# Patient Record
Sex: Male | Born: 1945 | Race: White | Hispanic: No | Marital: Married | Smoking: Never smoker
Health system: Southern US, Community
[De-identification: ages and names within clinical notes are randomized; demographics above are authoritative.]

## PROBLEM LIST (undated history)

## (undated) DIAGNOSIS — F329 Major depressive disorder, single episode, unspecified: Secondary | ICD-10-CM

## (undated) DIAGNOSIS — I219 Acute myocardial infarction, unspecified: Secondary | ICD-10-CM

## (undated) DIAGNOSIS — I10 Essential (primary) hypertension: Secondary | ICD-10-CM

## (undated) DIAGNOSIS — F32A Depression, unspecified: Secondary | ICD-10-CM

## (undated) DIAGNOSIS — E119 Type 2 diabetes mellitus without complications: Secondary | ICD-10-CM

## (undated) DIAGNOSIS — E039 Hypothyroidism, unspecified: Secondary | ICD-10-CM

## (undated) DIAGNOSIS — E079 Disorder of thyroid, unspecified: Secondary | ICD-10-CM

## (undated) HISTORY — DX: Type 2 diabetes mellitus without complications: E11.9

## (undated) HISTORY — DX: Depression, unspecified: F32.A

## (undated) HISTORY — DX: Essential (primary) hypertension: I10

## (undated) HISTORY — DX: Disorder of thyroid, unspecified: E07.9

## (undated) HISTORY — DX: Acute myocardial infarction, unspecified: I21.9

## (undated) HISTORY — DX: Hypothyroidism, unspecified: E03.9

---
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
## 1997-11-30 ENCOUNTER — Encounter: Admission: RE | Admit: 1997-11-30 | Discharge: 1997-11-30 | Payer: Self-pay | Admitting: Family Medicine

## 1998-06-13 ENCOUNTER — Encounter: Admission: RE | Admit: 1998-06-13 | Discharge: 1998-06-13 | Payer: Self-pay | Admitting: Family Medicine

## 1998-06-13 ENCOUNTER — Ambulatory Visit (HOSPITAL_COMMUNITY): Admission: RE | Admit: 1998-06-13 | Discharge: 1998-06-13 | Payer: Self-pay

## 2006-03-01 ENCOUNTER — Ambulatory Visit: Payer: Self-pay | Admitting: Internal Medicine

## 2007-03-29 ENCOUNTER — Ambulatory Visit: Payer: Self-pay | Admitting: Ophthalmology

## 2007-05-16 ENCOUNTER — Telehealth: Payer: Self-pay | Admitting: Internal Medicine

## 2011-05-08 ENCOUNTER — Inpatient Hospital Stay: Payer: Self-pay | Admitting: Internal Medicine

## 2012-08-10 IMAGING — CR DG CHEST 1V PORT
1 series · 1 of 1 positions shown · non-contrast
Comparison: none

REASON FOR EXAM: Chest pain
COMMENTS:

PROCEDURE:     DXR - DXR PORTABLE CHEST SINGLE VIEW  - May 08, 2011 [DATE]
RESULT:     The lungs are clear. The heart and pulmonary vessels are normal.
The bony and mediastinal structures are unremarkable. There is no effusion.
There is no pneumothorax or evidence of congestive failure.

[view not recorded]
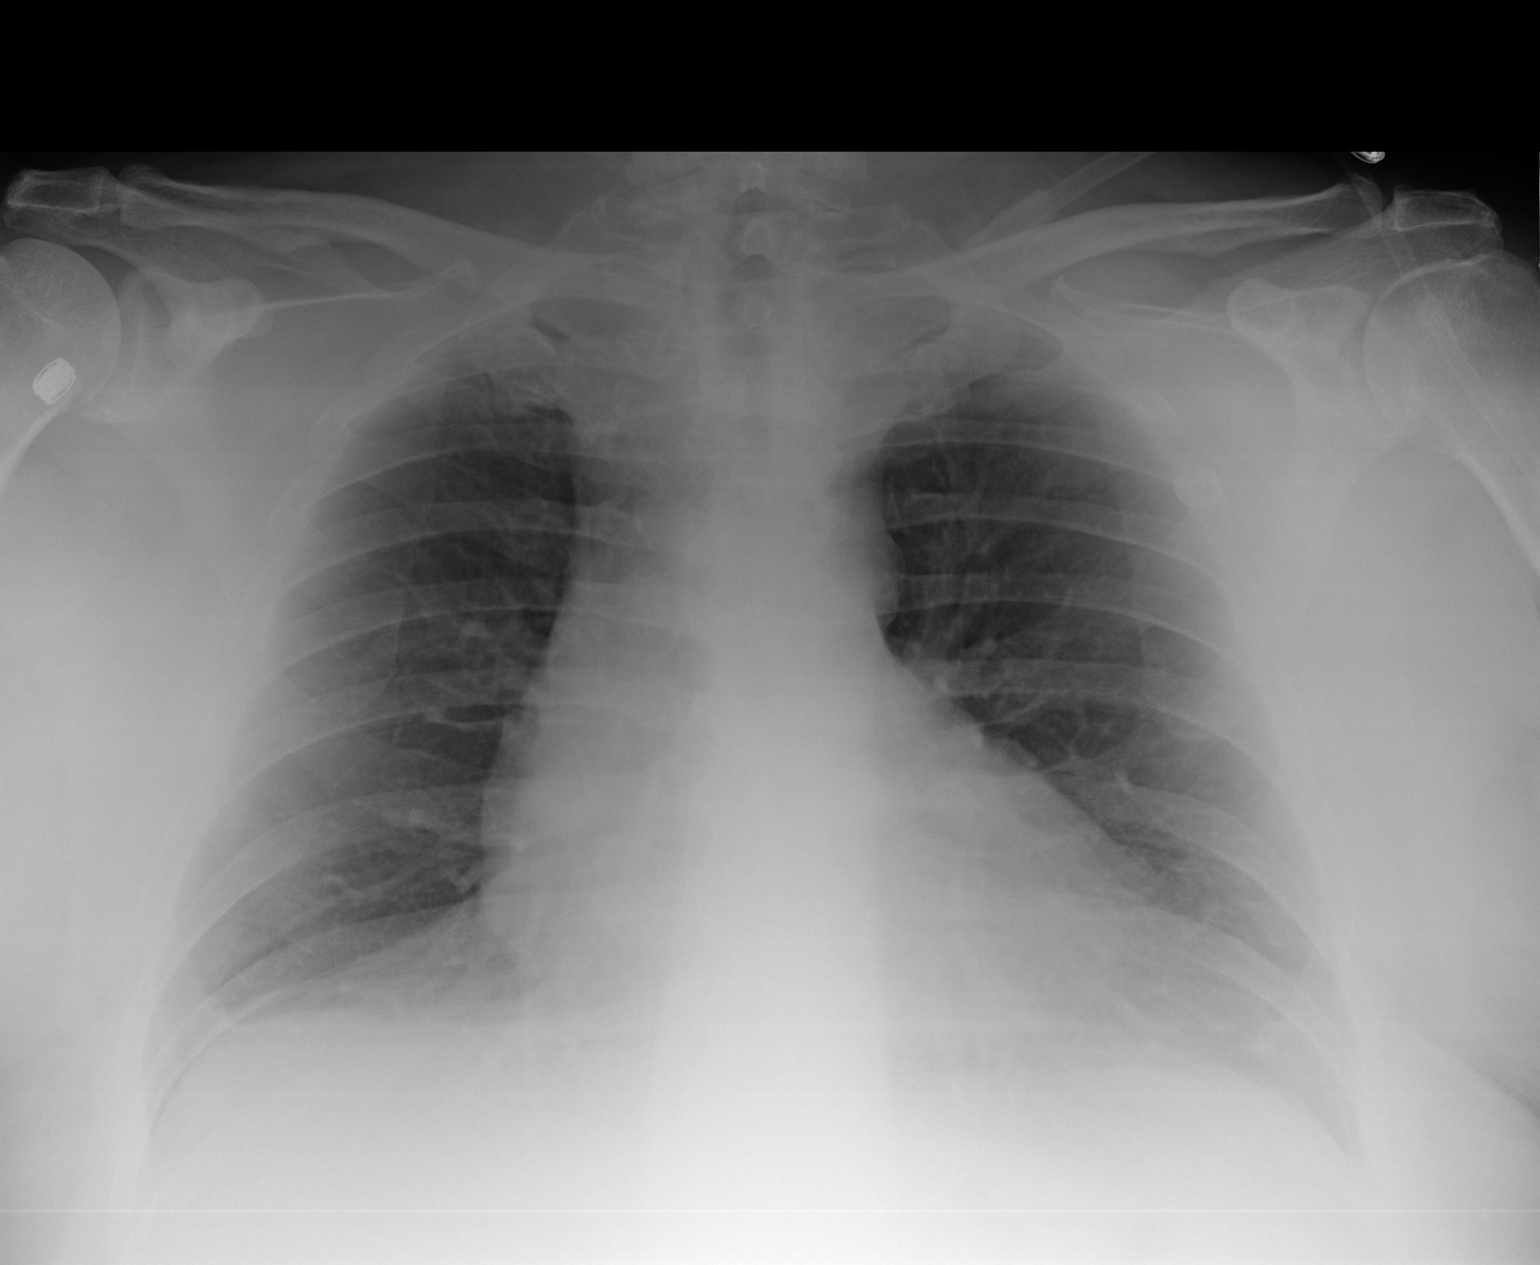

[1 of 1 positions shown; findings below may reference images not displayed]

IMPRESSION: No acute cardiopulmonary disease.

## 2012-08-12 IMAGING — NM NUCLEAR MEDICINE HEPATOHBILIARY INCLUDE GB
1 series · 11 of 11 positions shown · non-contrast
Comparison: none

REASON FOR EXAM: ruq pain
COMMENTS:

PROCEDURE:     NM  - NM HEPATOBILIARY IMAGE  - May 10, 2011  [DATE]
RESULT:
Procedure: The patient received intravenous administration of 8.54 mCi
Choletec. Standard imaging of the abdomen was obtained.

[Series 1000: gallbladder statics · 4.80mm/px · 11 of 11 slices shown]
[im 1/11]
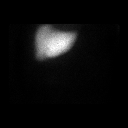
[im 2/11]
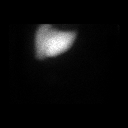
[im 3/11]
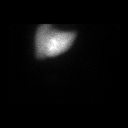
[im 4/11]
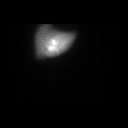
[im 5/11]
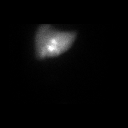
[im 6/11]
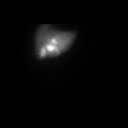
[im 7/11]
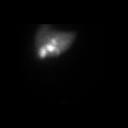
[im 8/11]
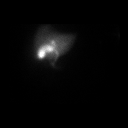
[im 9/11]
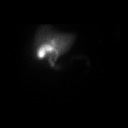
[im 10/11]
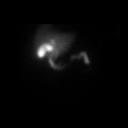
[im 11/11]
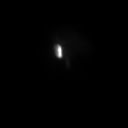

[11 of 11 positions shown; findings below may reference images not displayed]

FINDINGS: Diffuse homogeneous radiotracer activity is identified within the
liver. Intrahepatic biliary excretion is appreciated at 15 minutes, common
bile duct activity is identified at 20 minutes. Early gallbladder activity
is identified at 20 minutes with increased intensity appreciated throughout
the remainder of the study. Distal common bile duct activity is identified
at 65 minutes with excretion into small bowel at 70 minutes and peristalsis
appreciated throughout the remainder of the study.
IMPRESSION: Unremarkable nuclear medicine hepatobiliary evaluation.

## 2012-08-13 IMAGING — CR DG CHEST 1V PORT
1 series · 1 of 1 positions shown · non-contrast
Comparison: none

REASON FOR EXAM: low O2 sats rapid resp rate
COMMENTS:

[view not recorded]
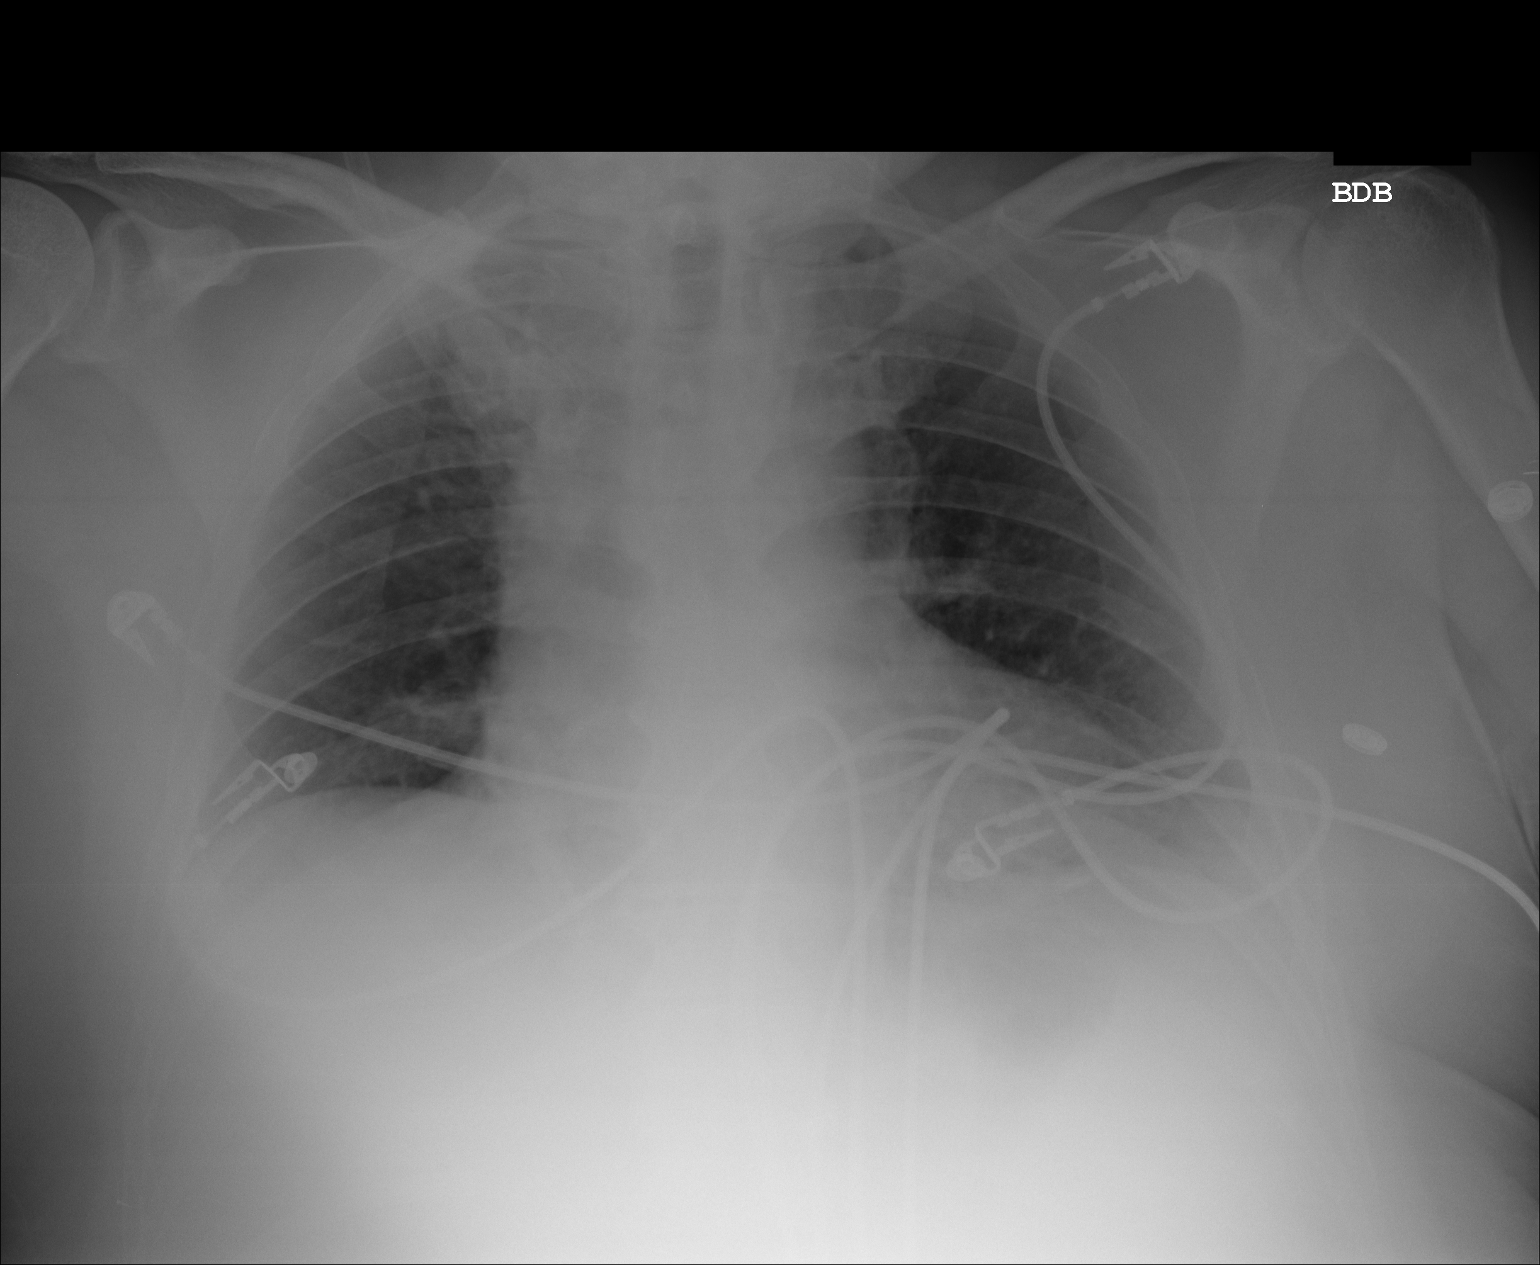

[1 of 1 positions shown; findings below may reference images not displayed]

PROCEDURE:     DXR - DXR PORTABLE CHEST SINGLE VIEW  - May 11, 2011  [DATE]

RESULT:     Comparison is made to prior exam 05/08/2011. The lung fields are
clear. No pneumonia, pneumothorax or pleural effusion is seen. No acute
changes of the heart or pulmonary vascularity are noted. Monitoring
electrodes are present.
IMPRESSION: 1. No acute changes are identified as compared to prior exam of 05/08/2011.

## 2013-04-10 ENCOUNTER — Ambulatory Visit: Payer: Self-pay | Admitting: Unknown Physician Specialty

## 2014-07-14 IMAGING — CR DG CHEST 2V
1 series · 2 of 2 positions shown · non-contrast
Comparison: none

REASON FOR EXAM: cough
COMMENTS:

PROCEDURE:     DXR - DXR CHEST PA (OR AP) AND LATERAL  - April 10, 2013  [DATE]
RESULT:
Comparison is made to a prior study dated 05/13/2011.

[Series 1: w chest pa · 0.14mm/px · 2 of 2 slices shown]
[im 1/2]
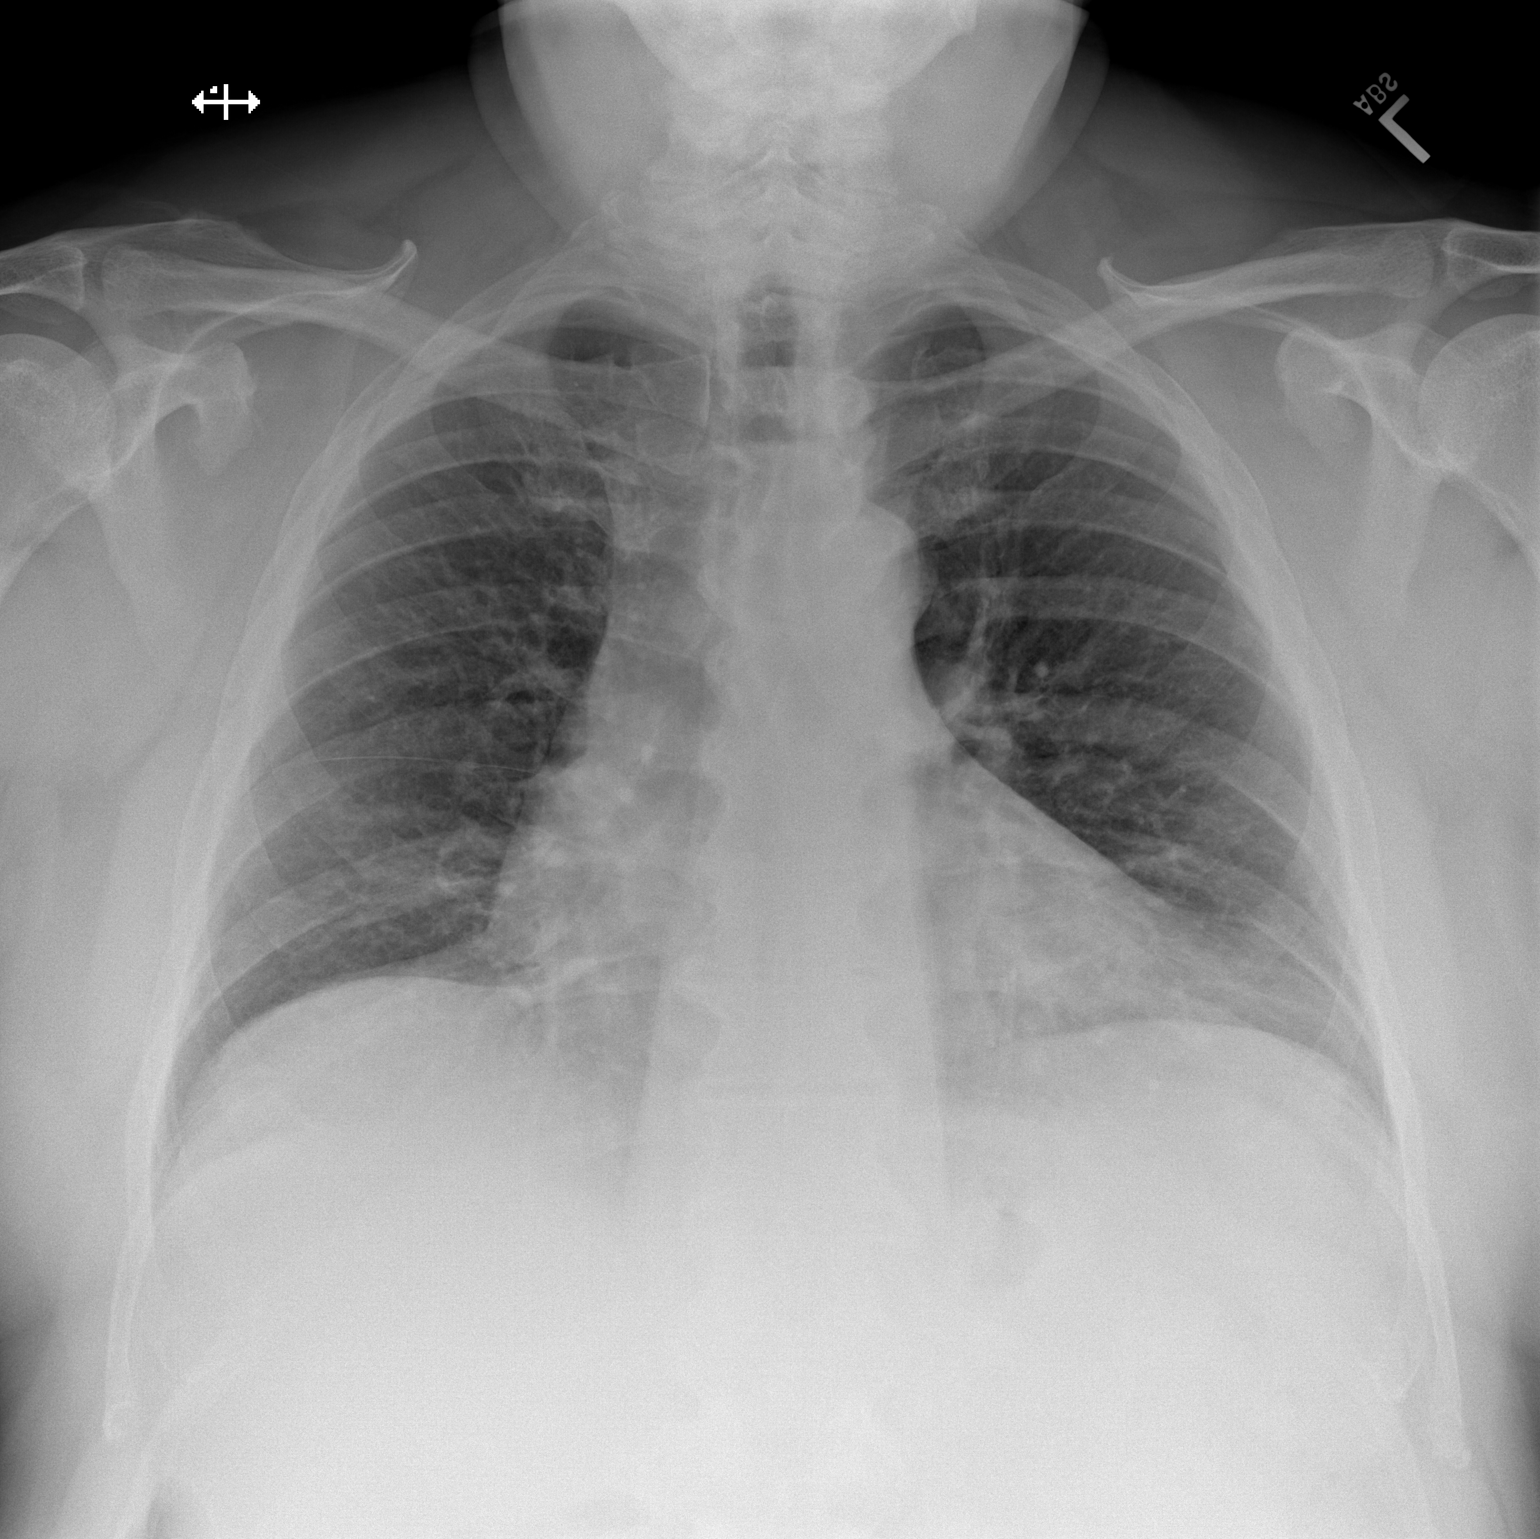
[im 2/2]
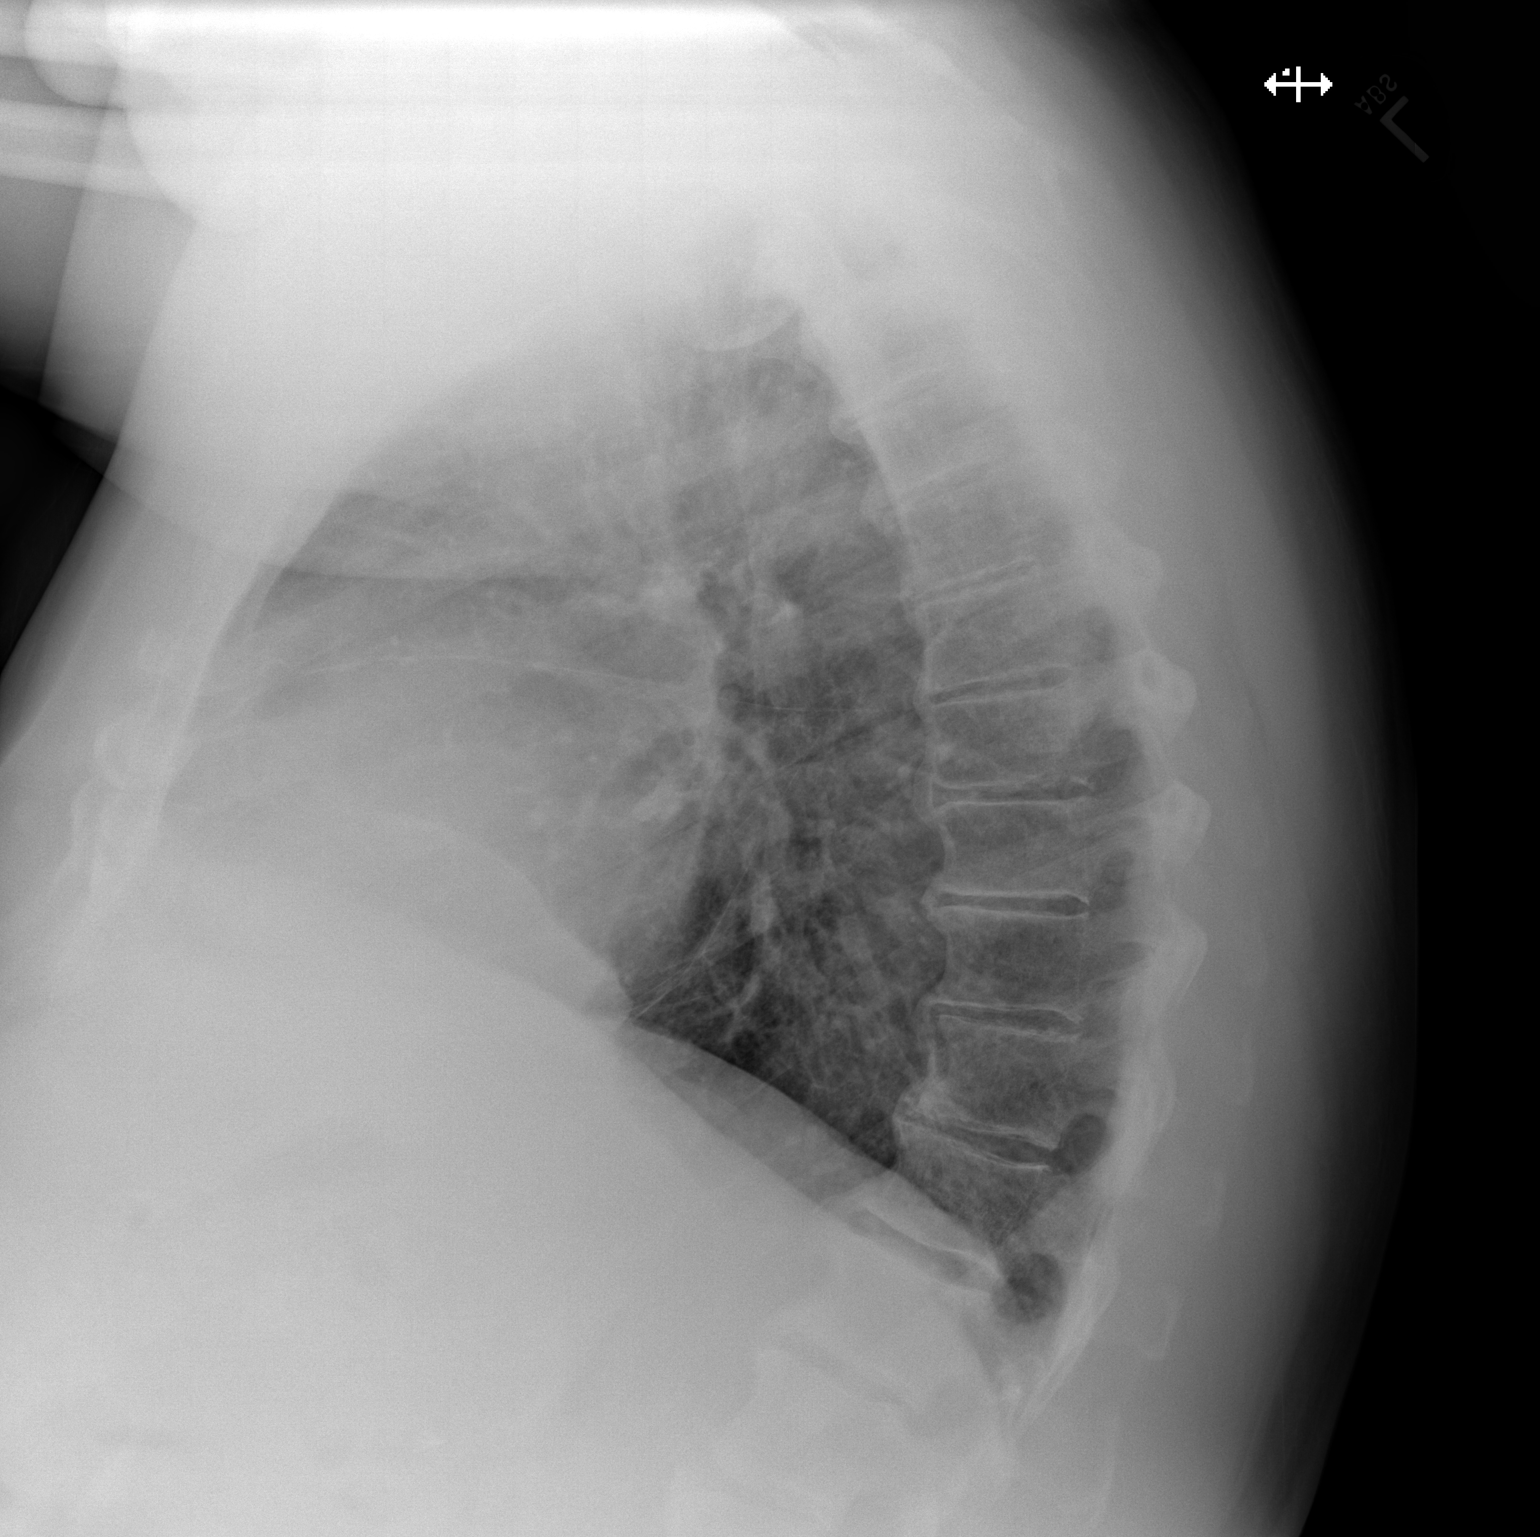

[2 of 2 positions shown; findings below may reference images not displayed]

FINDINGS: The patient has taken a shallow inspiration. This accentuates the
lung parenchymal findings. The cardiac silhouette is mildly enlarged. Mild
prominence of the interstitial markings is identified without focal regions
of consolidation or focal infiltrates.
IMPRESSION: Shallow inspiration. Interstitial findings which are
accentuated may represent mild pulmonary edema. An infectious or
inflammatory interstitial infiltrate also cannot be excluded.

## 2015-10-01 ENCOUNTER — Ambulatory Visit (INDEPENDENT_AMBULATORY_CARE_PROVIDER_SITE_OTHER): Payer: Medicare Other | Admitting: Urology

## 2015-10-01 DIAGNOSIS — N3281 Overactive bladder: Secondary | ICD-10-CM

## 2015-10-01 DIAGNOSIS — R350 Frequency of micturition: Secondary | ICD-10-CM | POA: Diagnosis not present

## 2015-12-03 ENCOUNTER — Ambulatory Visit: Payer: Medicare Other | Admitting: Urology

## 2019-06-13 ENCOUNTER — Encounter: Payer: Self-pay | Admitting: Urology

## 2019-06-13 ENCOUNTER — Ambulatory Visit (INDEPENDENT_AMBULATORY_CARE_PROVIDER_SITE_OTHER): Payer: Medicare Other | Admitting: Urology

## 2019-06-13 ENCOUNTER — Other Ambulatory Visit: Payer: Self-pay

## 2019-06-13 VITALS — BP 138/82 | HR 97 | Ht 68.0 in | Wt 260.0 lb

## 2019-06-13 DIAGNOSIS — N201 Calculus of ureter: Secondary | ICD-10-CM | POA: Diagnosis not present

## 2019-06-13 MED ORDER — TAMSULOSIN HCL 0.4 MG PO CAPS: 0.4000 mg | ORAL_CAPSULE | Freq: Every day | ORAL | 0 refills | Status: AC

## 2019-06-13 MED ORDER — OXYCODONE-ACETAMINOPHEN 10-325 MG PO TABS: 1.0000 | ORAL_TABLET | Freq: Four times a day (QID) | ORAL | 0 refills | Status: AC | PRN

## 2019-06-13 NOTE — Progress Notes (Signed)
06/13/19 12:56 PM   Christopher Larson 30-Jan-1946 [id number]  Referring provider: Jerl Mina, MD [address] [place],  [place] [address]  CC: Right groin pain  HPI: I saw Christopher Larson in urology clinic in consultation from Dr. Burnett Sheng for groin pain.  He is a 73 year old co-morbid male with CAD and diabetes who reports ~10 days of intermittent and colicky right-sided flank and groin pain.  He was seen at an outside hospital in [place] on 10/20 and CT scan showed a 4 mm right distal ureteral stone with upstream hydronephrosis, but no other stones.  Labs were benign, urinalysis was non-infected, and he was discharged home with Flomax and narcotics.  He has since run out of pain meds and continues to have right-sided groin pain.  He denies any fevers or chills or dysuria.  He has not taken any NSAIDs for the pain, only narcotics.  Severity is moderate to severe.  He denies any prior episodes of nephrolithiasis, and has never had any stone surgeries.  There are no aggravating factors.   PMH: Past Medical History:  Diagnosis Date  . Depression   . Diabetes mellitus without complication (HCC)   . Hypertension   . Hypothyroidism   . Myocardial infarct (HCC)   . Thyroid disease     Surgical History: Past Surgical History:  Procedure Laterality Date  . CHOLECYSTECTOMY  2010  . EYE SURGERY  1989    Allergies:  Allergies  Allergen Reactions  . Vancomycin Anaphylaxis  . Levofloxacin Rash    Family History: No family history on file.  Social History:  reports that he has never smoked. He has never used smokeless tobacco. He reports that he does not drink alcohol or use drugs.  ROS: Please see flowsheet from today's date for complete review of systems.  Physical Exam: Ht 5\' 8"  (1.727 m)   Wt 260 lb (117.9 kg)   BMI 39.53 kg/m    Constitutional:  Alert and oriented, No acute distress. Cardiovascular: No clubbing, cyanosis, or edema. Respiratory: Normal  respiratory effort, no increased work of breathing. GI: Abdomen is soft, nontender, nondistended, no abdominal masses GU: Right CVA tenderness Lymph: No cervical or inguinal lymphadenopathy. Skin: No rashes, bruises or suspicious lesions. Neurologic: Grossly intact, no focal deficits, moving all 4 extremities. Psychiatric: Normal mood and affect.  Laboratory Data: Reviewed from hospitalization 10/20, no leukocytosis, normal renal function, urinalysis with no bacteria, 0-5 WBCs  Pertinent Imaging: I have personally reviewed the CT imaging from 10/20 in the outside PACS system.  There is a 4 mm right distal ureteral stone with upstream hydronephrosis, no other renal stones noted.  Skin to stone distance is greater than 20 cm with his obesity.  Assessment & Plan:   In summary, the patient is a comorbid 73 year old male with CAD and diabetes who presents with ~10 days of intermittent right-sided flank and groin pain secondary to a 4 mm non-infected distal ureteral stone.  We discussed various treatment options for urolithiasis including observation with or without medical expulsive therapy, shockwave lithotripsy (SWL), ureteroscopy and laser lithotripsy with stent placement, and percutaneous nephrolithotomy.  We discussed that management is based on stone size, location, density, patient co-morbidities, and patient preference.   Stones <52mm in size have a >80% spontaneous passage rate. Data surrounding the use of tamsulosin for medical expulsive therapy is controversial, but meta analyses suggests it is most efficacious for distal stones between 5-13mm in size. Possible side effects include dizziness/lightheadedness, and  retrograde ejaculation.  SWL has a lower stone free rate in a single procedure, but also a lower complication rate compared to ureteroscopy and avoids a stent and associated stent related symptoms. Possible complications include renal hematoma, steinstrasse, and need for  additional treatment.  Ureteroscopy with laser lithotripsy and stent placement has a higher stone free rate than SWL in a single procedure, however increased complication rate including possible infection, ureteral injury, bleeding, and stent related morbidity. Common stent related symptoms include dysuria, urgency/frequency, and flank pain.  After an extensive discussion of the risks and benefits of the above treatment options, the patient would like to proceed with an additional week of medical expulsive therapy with Flomax, NSAIDs, and narcotics as needed, with scheduled ureteroscopy at the end of next week if he has not passed a stone yet.  He is not a candidate for shockwave secondary to distal stone with significant skin to stone distance.  A total of 60 minutes were spent face-to-face with the patient, greater than 50% was spent in patient education, counseling, and coordination of care regarding right distal ureteral stone.   Billey Co, [facility] [address], [place], [place] [address] [phone]

## 2019-06-13 NOTE — Patient Instructions (Signed)
Laser Therapy for Kidney Stones Laser therapy for kidney stones is a procedure to break up small, hard mineral deposits that form in the kidney (kidney stones). The procedure is done using a device that produces a focused beam of light (laser). The laser breaks up kidney stones into pieces that are small enough to be passed out of the body through urination or removed from the body during the procedure. You may need laser therapy if you have kidney stones that are painful or block your urinary tract. This procedure is done by inserting a tube (ureteroscope) into your kidney through the urethral opening. The urethra is the part of the body that drains urine from the bladder. In women, the urethra opens above the vaginal opening. In men, the urethra opens at the tip of the penis. The ureteroscope is inserted through the urethra, and surgical instruments are moved through the bladder and the muscular tube that connects the kidney to the bladder (ureter) until they reach the kidney. Tell a health care provider about:  Any allergies you have.  All medicines you are taking, including vitamins, herbs, eye drops, creams, and over-the-counter medicines.  Any problems you or family members have had with anesthetic medicines.  Any blood disorders you have.  Any surgeries you have had.  Any medical conditions you have.  Whether you are pregnant or may be pregnant. What are the risks? Generally, this is a safe procedure. However, problems may occur, including:  Infection.  Bleeding.  Allergic reactions to medicines.  Damage to the urethra, bladder, or ureter.  Urinary tract infection (UTI).  Narrowing of the urethra (urethral stricture).  Difficulty passing urine.  Blockage of the kidney caused by a fragment of kidney stone. What happens before the procedure? Medicines  Ask your health care provider about: ? Changing or stopping your regular medicines. This is especially important if you  are taking diabetes medicines or blood thinners. ? Taking medicines such as aspirin and ibuprofen. These medicines can thin your blood. Do not take these medicines unless your health care provider tells you to take them. ? Taking over-the-counter medicines, vitamins, herbs, and supplements. Eating and drinking Follow instructions from your health care provider about eating and drinking, which may include:  8 hours before the procedure - stop eating heavy meals or foods, such as meat, fried foods, or fatty foods.  6 hours before the procedure - stop eating light meals or foods, such as toast or cereal.  6 hours before the procedure - stop drinking milk or drinks that contain milk.  2 hours before the procedure - stop drinking clear liquids. Staying hydrated Follow instructions from your health care provider about hydration, which may include:  Up to 2 hours before the procedure - you may continue to drink clear liquids, such as water, clear fruit juice, black coffee, and plain tea.  General instructions  You may have a physical exam before the procedure. You may also have tests, such as imaging tests and blood or urine tests.  If your ureter is too narrow, your health care provider may place a soft, flexible tube (stent) inside of it. The stent may be placed days or weeks before your laser therapy procedure.  Plan to have someone take you home from the hospital or clinic.  If you will be going home right after the procedure, plan to have someone stay with you for 24 hours.  Do not use any products that contain nicotine or tobacco for at least 4   weeks before the procedure. These products include cigarettes, e-cigarettes, and chewing tobacco. If you need help quitting, ask your health care provider.  Ask your health care provider: ? How your surgical site will be marked or identified. ? What steps will be taken to help prevent infection. These may include:  Removing hair at the surgery  site.  Washing skin with a germ-killing soap.  Taking antibiotic medicine. What happens during the procedure?   An IV will be inserted into one of your veins.  You will be given one or more of the following: ? A medicine to help you relax (sedative). ? A medicine to numb the area (local anesthetic). ? A medicine to make you fall asleep (general anesthetic).  A ureteroscope will be inserted into your urethra. The ureteroscope will send images to a video screen in the operating room to guide your surgeon to the area of your kidney that will be treated.  A small, flexible tube will be threaded through the ureteroscope and into your bladder and ureter, up to your kidney.  The laser device will be inserted into your kidney through the tube. Your surgeon will pulse the laser on and off to break up kidney stones.  A surgical instrument that has a tiny wire basket may be inserted through the tube into your kidney to remove the pieces of broken kidney stone. The procedure may vary among health care providers and hospitals. What happens after the procedure?  Your blood pressure, heart rate, breathing rate, and blood oxygen level will be monitored until you leave the hospital or clinic.  You will be given pain medicine as needed.  You may continue to receive antibiotics.  You may have a stent temporarily placed in your ureter.  Do not drive for 24 hours if you were given a sedative during your procedure.  You may be given a strainer to collect any stone fragments that you pass in your urine. Your health care provider may have these tested. Summary  Laser therapy for kidney stones is a procedure to break up kidney stones into pieces that are small enough to be passed out of the body through urination or removed during the procedure.  Follow instructions from your health care provider about eating and drinking before the procedure.  During the procedure, the ureteroscope will send images  to a video screen to guide your surgeon to the area of your kidney that will be treated.  Do not drive for 24 hours if you were given a sedative during your procedure. This information is not intended to replace advice given to you by your health care provider. Make sure you discuss any questions you have with your health care provider. Document Released: 08/30/2015 Document Revised: 04/14/2018 Document Reviewed: 04/14/2018 Elsevier Patient Education  2020 [employer]   Ureteral Stent Implantation  Ureteral stent implantation is a procedure to insert (implant) a flexible, soft, plastic tube (stent) into a ureter. Ureters are the tube-like parts of the body that drain urine from the kidneys. The stent supports the ureter while it heals and helps to drain urine. You may have a ureteral stent implanted after having a procedure to remove a blockage from the ureter (ureterolysis or pyeloplasty). You may also have a stent implanted to open the flow of urine when you have a blockage caused by a kidney stone, tumor, blood clot, or infection. You have two ureters, one on each side of the body. The ureters connect the kidneys to the organ   that holds urine until it passes out of the body (bladder). The stent is placed so that one end is in the kidney, and one end is in the bladder. The stent is usually taken out after your ureter has healed. Depending on your condition, you may have a stent for just a few weeks, or you may have a long-term stent that will need to be replaced every few months. Tell a health care provider about:  Any allergies you have.  All medicines you are taking, including vitamins, herbs, eye drops, creams, and over-the-counter medicines.  Any problems you or family members have had with anesthetic medicines.  Any blood disorders you have.  Any surgeries you have had.  Any medical conditions you have.  Whether you are pregnant or may be pregnant. What are the risks? Generally,  this is a safe procedure. However, problems may occur, including:  Infection.  Bleeding.  Allergic reactions to medicines.  Damage to other structures or organs. Tearing (perforation) of the ureter is possible.  Movement of the stent away from where it is placed during surgery (migration). What happens before the procedure? Medicines Ask your health care provider about:  Changing or stopping your regular medicines. This is especially important if you are taking diabetes medicines or blood thinners.  Taking medicines such as aspirin and ibuprofen. These medicines can thin your blood. Do not take these medicines unless your health care provider tells you to take them.  Taking over-the-counter medicines, vitamins, herbs, and supplements. Eating and drinking Follow instructions from your health care provider about eating and drinking, which may include:  8 hours before the procedure - stop eating heavy meals or foods, such as meat, fried foods, or fatty foods.  6 hours before the procedure - stop eating light meals or foods, such as toast or cereal.  6 hours before the procedure - stop drinking milk or drinks that contain milk.  2 hours before the procedure - stop drinking clear liquids. Staying hydrated Follow instructions from your health care provider about hydration, which may include:  Up to 2 hours before the procedure - you may continue to drink clear liquids, such as water, clear fruit juice, black coffee, and plain tea. General instructions  Do not drink alcohol.  Do not use any products that contain nicotine or tobacco for at least 4 weeks before the procedure. These products include cigarettes, e-cigarettes, and chewing tobacco. If you need help quitting, ask your health care provider.  You may have an exam or testing, such as imaging or blood tests.  Ask your health care provider what steps will be taken to help prevent infection. These may include: ? Removing hair  at the surgery site. ? Washing skin with a germ-killing soap. ? Taking antibiotic medicine.  Plan to have someone take you home from the hospital or clinic.  If you will be going home right after the procedure, plan to have someone with you for 24 hours. What happens during the procedure?  An IV will be inserted into one of your veins.  You may be given a medicine to help you relax (sedative).  You may be given a medicine to make you fall asleep (general anesthetic).  A thin, tube-shaped instrument with a light and tiny camera at the end (cystoscope) will be inserted into your urethra. The urethra is the tube that drains urine from the bladder out of the body. In men, the urethra opens at the end of the penis. In women,   the urethra opens in front of the vaginal opening.  The cystoscope will be passed into your bladder.  A thin wire (guide wire) will be passed through your bladder and into your ureter. This is used to guide the stent into your ureter.  The stent will be inserted into your ureter.  The guide wire and the cystoscope will be removed.  A flexible tube (catheter) may be inserted through your urethra so that one end is in your bladder. This helps to drain urine from your bladder. The procedure may vary among hospitals and health care providers. What happens after the procedure?  Your blood pressure, heart rate, breathing rate, and blood oxygen level will be monitored until you leave the hospital or clinic.  You may continue to receive medicine and fluids through an IV.  You may have some soreness or pain in your abdomen and urethra. Medicines will be available to help you.  You will be encouraged to get up and walk around as soon as you can.  You may have a catheter draining your urine.  You will have some blood in your urine.  Do not drive for 24 hours if you were given a sedative during your procedure. Summary  Ureteral stent implantation is a procedure to  insert a flexible, soft, plastic tube (stent) into a ureter.  You may have a stent implanted to support the ureter while it heals after a procedure or to open the flow of urine if there is a blockage.  Follow instructions from your health care provider about taking medicines and about eating and drinking before the procedure.  Depending on your condition, you may have a stent for just a few weeks, or you may have a long-term stent that will need to be replaced every few months. This information is not intended to replace advice given to you by your health care provider. Make sure you discuss any questions you have with your health care provider. Document Released: 07/31/2000 Document Revised: 05/10/2018 Document Reviewed: 05/11/2018 Elsevier Patient Education  2020 [employer]   

## 2019-06-14 ENCOUNTER — Other Ambulatory Visit: Payer: Self-pay | Admitting: Radiology

## 2019-06-14 ENCOUNTER — Ambulatory Visit: Payer: Self-pay | Admitting: Urology

## 2019-06-14 DIAGNOSIS — N201 Calculus of ureter: Secondary | ICD-10-CM

## 2019-06-16 ENCOUNTER — Telehealth: Payer: Self-pay | Admitting: Radiology

## 2019-06-16 ENCOUNTER — Other Ambulatory Visit: Payer: Self-pay | Admitting: Radiology

## 2019-06-16 ENCOUNTER — Ambulatory Visit: Payer: Self-pay | Admitting: Urology

## 2019-06-16 DIAGNOSIS — N201 Calculus of ureter: Secondary | ICD-10-CM

## 2019-06-16 NOTE — Telephone Encounter (Signed)
Wife called to report that patient would like to cancel surgery scheduled 06/23/2019. He would like to try to pass stone without surgery. Please advise.

## 2019-06-16 NOTE — Telephone Encounter (Signed)
OK, its a 37mm stone so it should pass, he was just having a lot of pain. Lets set him up for 2 weeks with Sam with a renal US to confirm stone passage.  Nickolas Madrid, MD 06/16/2019

## 2019-06-16 NOTE — Telephone Encounter (Signed)
Cancelled surgery per patient request. Follow up appointment made & renal US ordered. Wife expresses understanding of conversation.

## 2019-06-19 ENCOUNTER — Other Ambulatory Visit: Admission: RE | Admit: 2019-06-19 | Payer: 59 | Source: Ambulatory Visit

## 2019-06-20 ENCOUNTER — Other Ambulatory Visit: Payer: 59

## 2019-06-23 ENCOUNTER — Encounter: Admission: RE | Payer: Self-pay | Source: Home / Self Care

## 2019-06-23 ENCOUNTER — Ambulatory Visit: Admission: RE | Admit: 2019-06-23 | Payer: Medicare Other | Source: Home / Self Care | Admitting: Urology

## 2019-06-23 SURGERY — CYSTOSCOPY/URETEROSCOPY/HOLMIUM LASER/STENT PLACEMENT
Anesthesia: Choice | Laterality: Right

## 2019-06-29 ENCOUNTER — Other Ambulatory Visit: Payer: Medicare Other | Admitting: Urology

## 2019-07-05 ENCOUNTER — Ambulatory Visit: Payer: Medicare Other | Admitting: Physician Assistant

## 2019-07-10 ENCOUNTER — Other Ambulatory Visit: Payer: Self-pay | Admitting: *Deleted
